# Patient Record
Sex: Female | Born: 2000 | Marital: Single | Smoking: Never smoker
Health system: Southern US, Community
[De-identification: ages and names within clinical notes are randomized; demographics above are authoritative.]

## PROBLEM LIST (undated history)

## (undated) DIAGNOSIS — R519 Headache, unspecified: Secondary | ICD-10-CM

## (undated) HISTORY — DX: Headache, unspecified: R51.9

---

## 2021-11-23 ENCOUNTER — Ambulatory Visit
Admission: RE | Admit: 2021-11-23 | Discharge: 2021-11-23 | Disposition: A | Discharge status: Home / Self Care | Payer: Federal, State, Local not specified - PPO | Source: Ambulatory Visit | Attending: Family Medicine | Admitting: Family Medicine

## 2021-11-23 ENCOUNTER — Other Ambulatory Visit: Payer: Self-pay | Admitting: Family Medicine

## 2021-11-23 DIAGNOSIS — R1012 Left upper quadrant pain: Secondary | ICD-10-CM | POA: Diagnosis present

## 2021-11-23 DIAGNOSIS — R1011 Right upper quadrant pain: Secondary | ICD-10-CM

## 2021-11-23 DIAGNOSIS — B2790 Infectious mononucleosis, unspecified without complication: Secondary | ICD-10-CM | POA: Diagnosis present
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
## 2022-04-20 ENCOUNTER — Encounter: Payer: Self-pay | Admitting: Psychiatry

## 2022-04-20 ENCOUNTER — Ambulatory Visit: Payer: Federal, State, Local not specified - PPO | Admitting: Psychiatry

## 2022-04-20 VITALS — BP 116/77 | HR 98 | Temp 99.0°F | Ht 63.0 in | Wt 186.0 lb

## 2022-04-20 DIAGNOSIS — F330 Major depressive disorder, recurrent, mild: Secondary | ICD-10-CM | POA: Diagnosis not present

## 2022-04-20 MED ORDER — SERTRALINE HCL 50 MG PO TABS: ORAL_TABLET | ORAL | 0 refills | Status: AC

## 2022-04-20 NOTE — Patient Instructions (Signed)
Start sertraline 25 mg at night for one week, then 50 mg at night  Next appointment: 11/22 at 4:30
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
## 2022-05-17 ENCOUNTER — Telehealth: Payer: Federal, State, Local not specified - PPO | Admitting: Psychiatry
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
## 2022-06-13 ENCOUNTER — Ambulatory Visit: Payer: Federal, State, Local not specified - PPO | Admitting: Psychiatry

## 2023-06-27 IMAGING — US US ABDOMEN COMPLETE
2 series · 14 of 25 positions shown · non-contrast
Comparison: None Available.

CLINICAL DATA: Abdominal pain for 2-3 weeks. Infectious
mononucleosis.

EXAM:
ABDOMEN ULTRASOUND COMPLETE

[Series 1: us abdomen complete · 1 of 10 slices shown (1 of 2)]
[im 1/10]
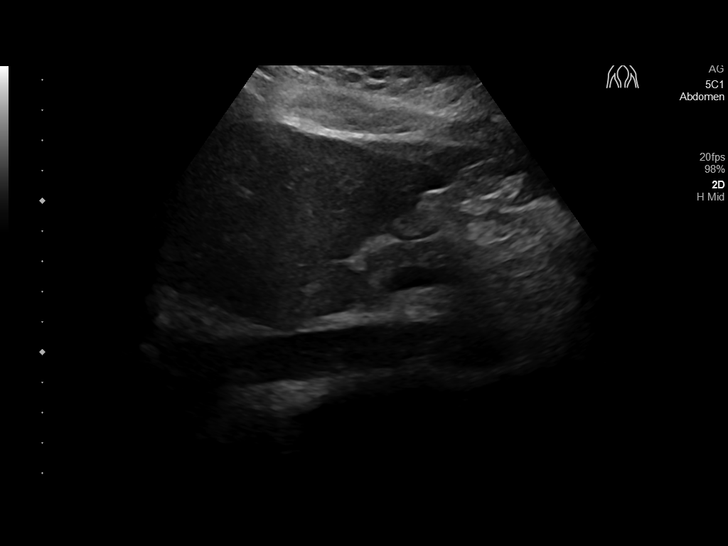

[Series 2: us abdomen complete · 13 of 106 slices shown (2 of 2)]
[im 1/106]
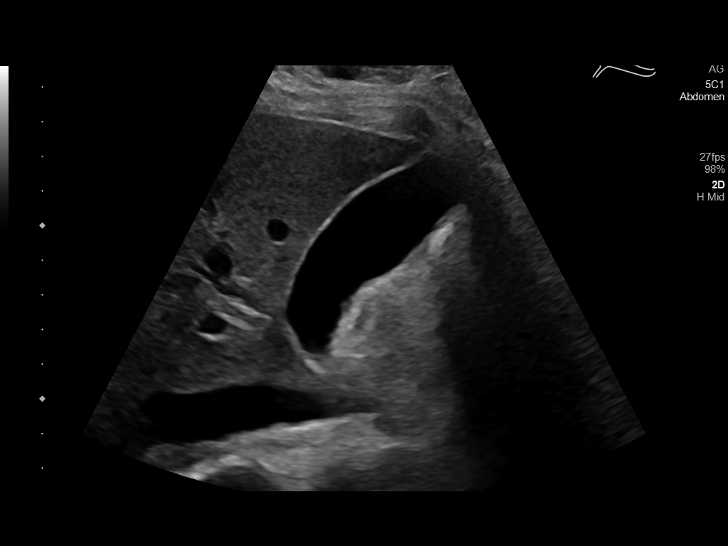
[im 10/106]
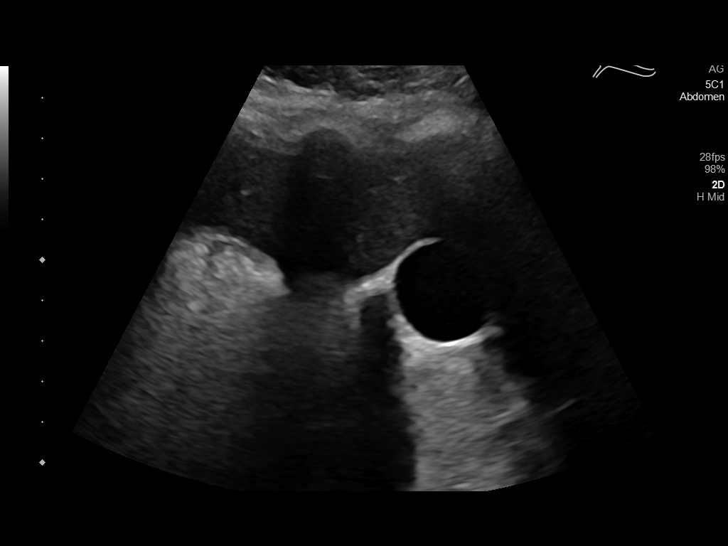
[im 20/106]
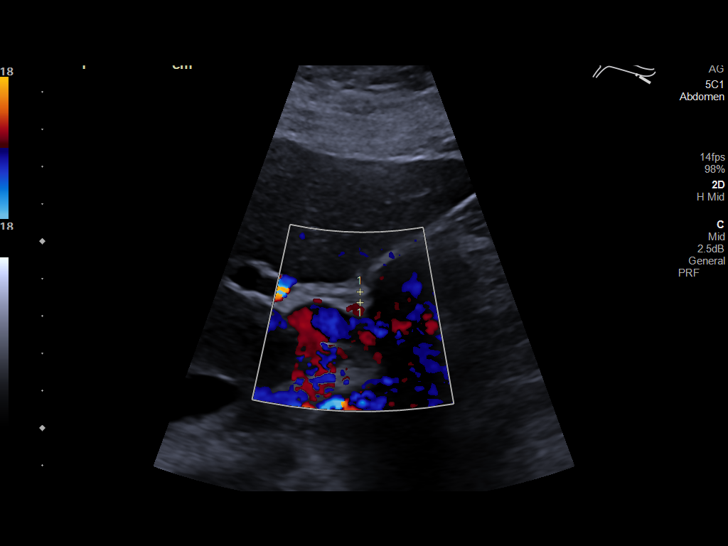
[im 29/106]
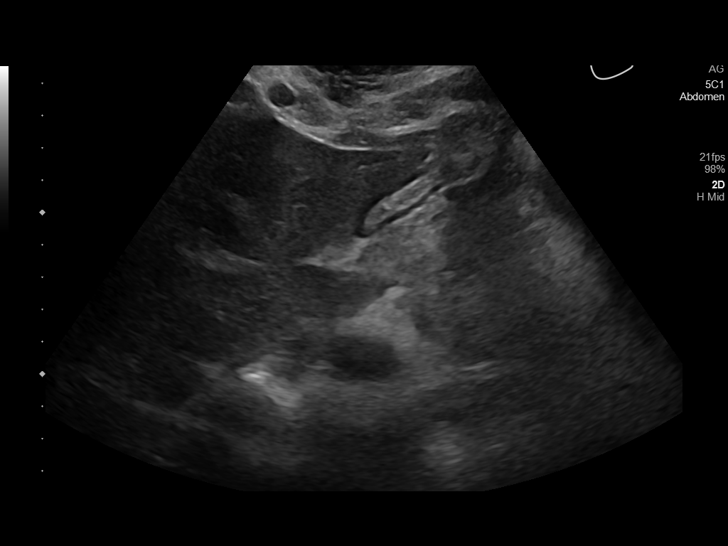
[im 34/106]
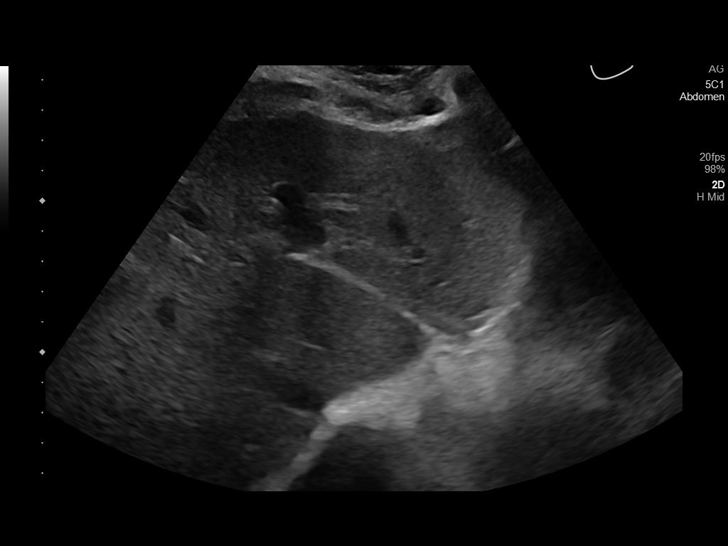
[im 43/106]
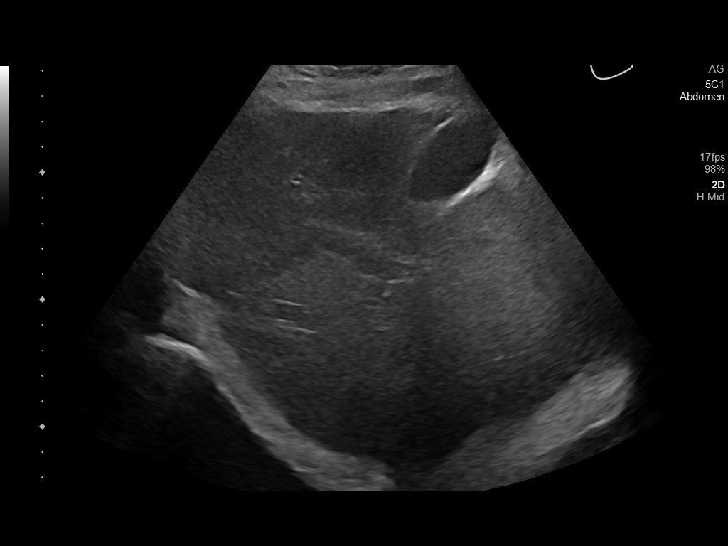
[im 53/106]
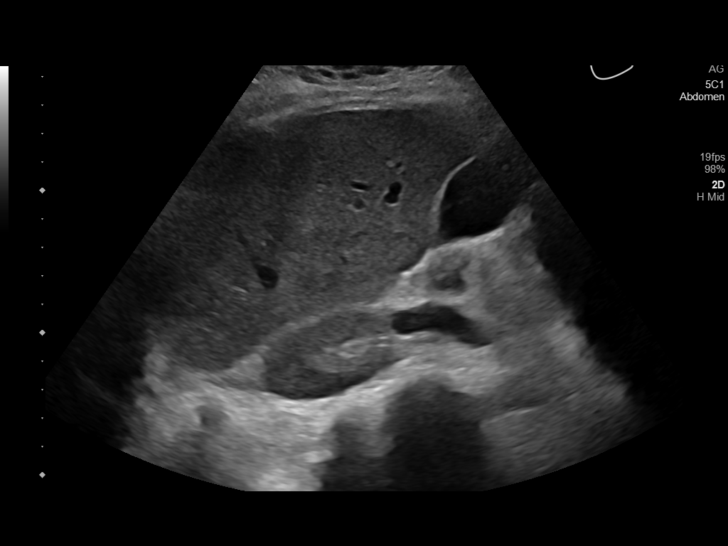
[im 63/106]
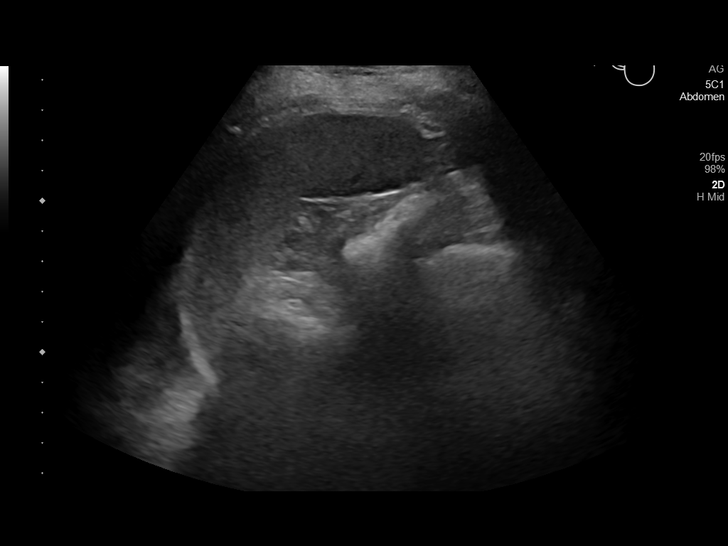
[im 67/106]
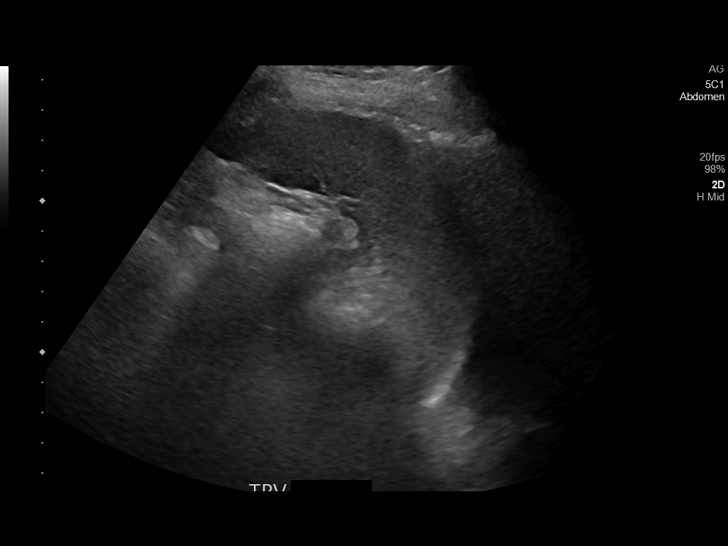
[im 77/106]
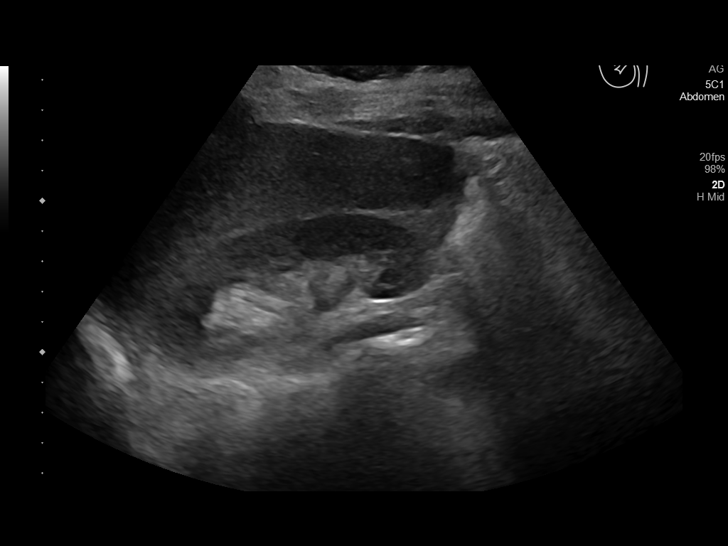
[im 86/106]
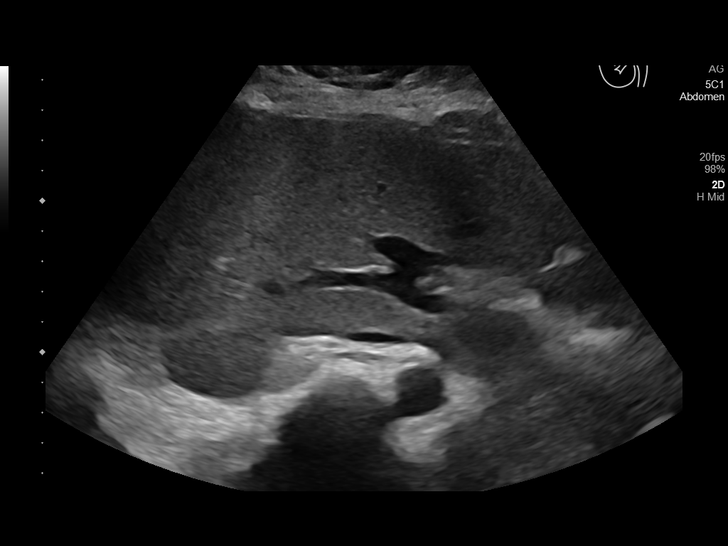
[im 96/106]
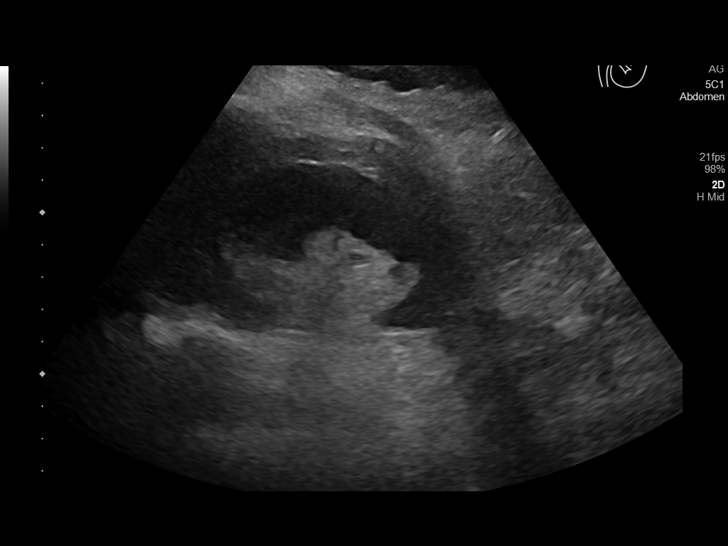
[im 106/106]
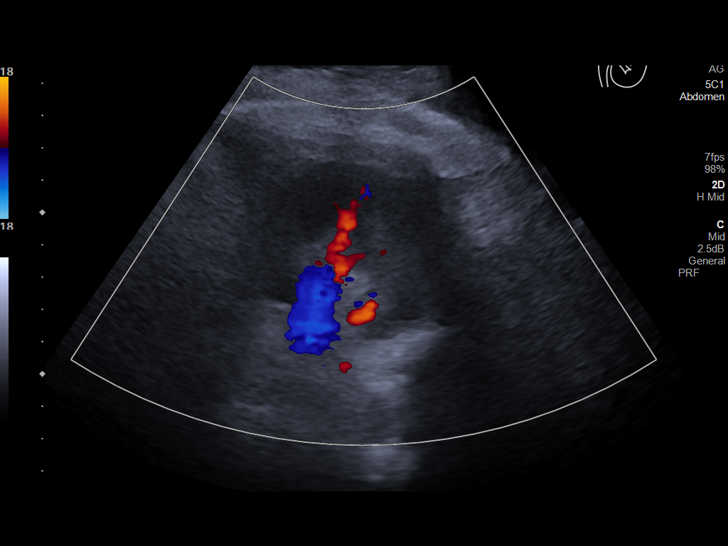

[14 of 25 positions shown; findings below may reference images not displayed]

FINDINGS: Gallbladder: No gallstones or wall thickening visualized. No
sonographic Murphy sign noted by sonographer.

Common bile duct: Diameter: 3 mm, within normal limits.

Liver: No focal lesion identified. Within normal limits in
parenchymal echogenicity. Portal vein is patent on color Doppler
imaging with normal direction of blood flow towards the liver.

IVC: No abnormality visualized.

Pancreas: Visualized portion unremarkable.

Spleen: Size and appearance within normal limits. Measures 8.2 cm in
length.

Right Kidney: Length: 10.1 cm. Echogenicity within normal limits. No
mass or hydronephrosis visualized.

Left Kidney: Length: 9.9 cm. Echogenicity within normal limits. No
mass or hydronephrosis visualized.

Abdominal aorta: No aneurysm visualized.

Other findings: None.
IMPRESSION: Negative abdomen ultrasound. No evidence of splenomegaly or other
significant abnormality.

## 2023-09-10 ENCOUNTER — Encounter: Payer: Federal, State, Local not specified - PPO | Attending: Internal Medicine | Admitting: Dietician

## 2023-09-10 ENCOUNTER — Encounter: Payer: Self-pay | Admitting: Dietician

## 2023-09-10 VITALS — Ht 63.0 in | Wt 200.2 lb

## 2023-09-10 DIAGNOSIS — E66812 Obesity, class 2: Secondary | ICD-10-CM | POA: Diagnosis not present

## 2023-09-10 DIAGNOSIS — E282 Polycystic ovarian syndrome: Secondary | ICD-10-CM

## 2023-09-10 DIAGNOSIS — Z6835 Body mass index (BMI) 35.0-35.9, adult: Secondary | ICD-10-CM

## 2023-09-10 NOTE — Progress Notes (Signed)
 Medical Nutrition Therapy: Visit start time: 0930  end time: 1030  Assessment:   Referral Diagnosis: PCOS, obesity Other medical history/ diagnoses: none significant Psychosocial issues/ stress concerns: some symptoms of depression, history of depression  Medications, supplements: reconciled list in medical record   Current weight: 200.2lbs Height: 5'3" BMI: 35.46   Progress and evaluation:  Patient reports weight loss of about 40lbs 2 years ago with exercising about 2 hrs daily and eating less due to busy schedule; she has recently been gaining some weight back Diagnosed with PCOS in 2020. Was taking metFORMIN but stopped due to GI side effects.   She has resumed some exercise and is using meal delivery service for homemade dinners. Food allergies: none known Special diet practices: none   Dietary Intake:  Usual eating pattern includes 2 meals and several snacks per day. Dining out frequency: 1-2 meals per week. Who plans meals/ buys groceries? self Who prepares meals? self  Breakfast: 10- 11:30am bacon egg and cheese bagel; pancake bites with strawberry and choc chip Snack: sometimes, depends on what's available in home Lunch:  Snack: same as am Supper: anytime between 4 - 8pm recent "factor" meal plan ie chicken veg pasta,  Snack: frozen dark chocolate blueberries (homemade) Beverages: mostly water; lemonade; occ chocolate milk  Physical activity: rowing machine, [place] 10-15 minutes each daily; less in the past week   Intervention:   Nutrition Care Education:   Basic nutrition: basic food groups; appropriate nutrient balance; appropriate meal and snack schedule; general nutrition guidelines; healthy and balanced options for light meals and snacks; Mediterranean eating pattern for overall health value Advanced nutrition: checking food labels for carb content, sugar; limiting unhealthy fat and sodium Weight control: importance of low sugar and low fat choices; portion  control; estimated energy needs for weight loss at 1500 kcal, provided guidance for 45% CHO, 25% pro, (30% fat); PCOS: appropriate meal and snack schedule; appropriate carb intake and balance, healthy carb choices; role of fiber, protein, fat; physical activity   Other intervention notes: Patient has been working on healthy habits and is motivated to continue. Established additional goals with direction from patient.  Follow up scheduled for May when patient returns home from college   Nutritional Diagnosis:  [place]-2.1 Inpaired nutrition utilization As related to PCOS.  As evidenced by history of irregular menses, elevated insulin level, weight gain. [place]-3.3 Overweight/obesity As related to PCOS, history of excess calories, history of depression.  As evidenced by patient with current BMI of 35.46.   Education Materials given:  Designer, industrial/product with food lists, sample meal pattern Sample menus Snacking handout Visit summary with goals/ instructions to be viewed via patient portal   Learner/ who was taught:  Patient   Level of understanding: Verbalizes/ demonstrates competency  Demonstrated degree of understanding via:   Teach back Learning barriers: None  Willingness to learn/ readiness for change: Eager, change in progress  Monitoring and Evaluation:  Dietary intake, exercise, and body weight      follow up:  11/14/23

## 2023-09-10 NOTE — Patient Instructions (Signed)
 Plan to eat a meal or snack every 3-5 hours during the day.  Have healthy snack options such as fruit, yogurt, nuts, low fat cheese sticks, graham crackers, whole grain bread/ crackers A light meal can satisfy more than a snack if there is time -- small salad with chopped deli meat and shredded cheese or nuts or beans; yogurt and fruit; 1/2 or 1 small sandwich can be quick and simple options Keep up the regular exercise, good job so far! Goal is 150 minutes weekly of light (walking etc) or 75 minutes of moderate-high intensity.  Light activity (house chores, walking) for 10-15 minutes after eating a meal or high carb snack can decrease the effect on blood sugar.

## 2023-11-14 ENCOUNTER — Ambulatory Visit: Admitting: Dietician

## 2023-12-27 ENCOUNTER — Other Ambulatory Visit: Payer: Self-pay

## 2023-12-27 DIAGNOSIS — D1802 Hemangioma of intracranial structures: Secondary | ICD-10-CM

## 2023-12-27 DIAGNOSIS — E348 Other specified endocrine disorders: Secondary | ICD-10-CM

## 2023-12-27 DIAGNOSIS — G43019 Migraine without aura, intractable, without status migrainosus: Secondary | ICD-10-CM

## 2023-12-30 ENCOUNTER — Ambulatory Visit
Admission: RE | Admit: 2023-12-30 | Discharge: 2023-12-30 | Disposition: A | Discharge status: Home / Self Care | Source: Ambulatory Visit

## 2023-12-30 DIAGNOSIS — G43019 Migraine without aura, intractable, without status migrainosus: Secondary | ICD-10-CM | POA: Insufficient documentation

## 2023-12-30 DIAGNOSIS — E348 Other specified endocrine disorders: Secondary | ICD-10-CM | POA: Insufficient documentation

## 2023-12-30 DIAGNOSIS — D1802 Hemangioma of intracranial structures: Secondary | ICD-10-CM | POA: Insufficient documentation

## 2023-12-30 MED ORDER — GADOBUTROL 1 MMOL/ML IV SOLN
8.0000 mL | Freq: Once | INTRAVENOUS | Status: AC | PRN
  Administered 2023-12-30: 8 mL via INTRAVENOUS

## 2024-05-05 ENCOUNTER — Ambulatory Visit
Admission: RE | Admit: 2024-05-05 | Discharge: 2024-05-05 | Disposition: A | Discharge status: Home / Self Care | Source: Ambulatory Visit | Attending: Student | Admitting: Student

## 2024-05-05 ENCOUNTER — Other Ambulatory Visit: Payer: Self-pay | Admitting: Student

## 2024-05-05 DIAGNOSIS — M79604 Pain in right leg: Secondary | ICD-10-CM
